# Patient Record
Sex: Female | Born: 1980 | Race: Black or African American | Hispanic: No | Marital: Married | Smoking: Current every day smoker
Health system: Southern US, Community
[De-identification: ages and names within clinical notes are randomized; demographics above are authoritative.]

## PROBLEM LIST (undated history)

## (undated) DIAGNOSIS — G8929 Other chronic pain: Secondary | ICD-10-CM

## (undated) DIAGNOSIS — M545 Low back pain, unspecified: Secondary | ICD-10-CM

## (undated) DIAGNOSIS — F329 Major depressive disorder, single episode, unspecified: Secondary | ICD-10-CM

## (undated) DIAGNOSIS — F32A Depression, unspecified: Secondary | ICD-10-CM

## (undated) DIAGNOSIS — F1010 Alcohol abuse, uncomplicated: Secondary | ICD-10-CM

## (undated) DIAGNOSIS — F410 Panic disorder [episodic paroxysmal anxiety] without agoraphobia: Secondary | ICD-10-CM

## (undated) DIAGNOSIS — F3160 Bipolar disorder, current episode mixed, unspecified: Secondary | ICD-10-CM

## (undated) DIAGNOSIS — I10 Essential (primary) hypertension: Secondary | ICD-10-CM

## (undated) DIAGNOSIS — R569 Unspecified convulsions: Secondary | ICD-10-CM

## (undated) DIAGNOSIS — J45909 Unspecified asthma, uncomplicated: Secondary | ICD-10-CM

---

## 2015-02-06 ENCOUNTER — Emergency Department (HOSPITAL_BASED_OUTPATIENT_CLINIC_OR_DEPARTMENT_OTHER)
Admission: EM | Admit: 2015-02-06 | Discharge: 2015-02-06 | Disposition: A | Discharge status: Home / Self Care | Payer: Medicare HMO | Attending: Emergency Medicine | Admitting: Emergency Medicine

## 2015-02-06 ENCOUNTER — Encounter (HOSPITAL_BASED_OUTPATIENT_CLINIC_OR_DEPARTMENT_OTHER): Payer: Self-pay | Admitting: Emergency Medicine

## 2015-02-06 DIAGNOSIS — F419 Anxiety disorder, unspecified: Secondary | ICD-10-CM | POA: Diagnosis not present

## 2015-02-06 DIAGNOSIS — I10 Essential (primary) hypertension: Secondary | ICD-10-CM | POA: Insufficient documentation

## 2015-02-06 DIAGNOSIS — Z720 Tobacco use: Secondary | ICD-10-CM | POA: Insufficient documentation

## 2015-02-06 DIAGNOSIS — R110 Nausea: Secondary | ICD-10-CM | POA: Diagnosis present

## 2015-02-06 DIAGNOSIS — Z880 Allergy status to penicillin: Secondary | ICD-10-CM | POA: Insufficient documentation

## 2015-02-06 DIAGNOSIS — J45909 Unspecified asthma, uncomplicated: Secondary | ICD-10-CM | POA: Diagnosis not present

## 2015-02-06 DIAGNOSIS — G43009 Migraine without aura, not intractable, without status migrainosus: Secondary | ICD-10-CM | POA: Diagnosis not present

## 2015-02-06 DIAGNOSIS — Z3202 Encounter for pregnancy test, result negative: Secondary | ICD-10-CM | POA: Insufficient documentation

## 2015-02-06 DIAGNOSIS — F329 Major depressive disorder, single episode, unspecified: Secondary | ICD-10-CM | POA: Diagnosis not present

## 2015-02-06 HISTORY — DX: Panic disorder (episodic paroxysmal anxiety): F41.0

## 2015-02-06 HISTORY — DX: Essential (primary) hypertension: I10

## 2015-02-06 HISTORY — DX: Unspecified asthma, uncomplicated: J45.909

## 2015-02-06 HISTORY — DX: Unspecified convulsions: R56.9

## 2015-02-06 LAB — URINALYSIS, ROUTINE W REFLEX MICROSCOPIC
Bilirubin Urine: NEGATIVE
GLUCOSE, UA: NEGATIVE mg/dL
KETONES UR: 15 mg/dL — AB
Nitrite: NEGATIVE
PH: 6 (ref 5.0–8.0)
Protein, ur: NEGATIVE mg/dL
Specific Gravity, Urine: 1.017 (ref 1.005–1.030)
UROBILINOGEN UA: 1 mg/dL (ref 0.0–1.0)

## 2015-02-06 LAB — URINE MICROSCOPIC-ADD ON

## 2015-02-06 LAB — PREGNANCY, URINE: Preg Test, Ur: NEGATIVE

## 2015-02-06 MED ORDER — IBUPROFEN 800 MG PO TABS
800.0000 mg | ORAL_TABLET | Freq: Once | ORAL | Status: AC
  Administered 2015-02-06: 800 mg via ORAL
  Filled 2015-02-06: qty 1

## 2015-02-06 MED ORDER — ACETAMINOPHEN 500 MG PO TABS
1000.0000 mg | ORAL_TABLET | Freq: Once | ORAL | Status: AC
  Administered 2015-02-06: 1000 mg via ORAL
  Filled 2015-02-06: qty 2

## 2015-02-06 MED ORDER — PROMETHAZINE HCL 25 MG PO TABS
25.0000 mg | ORAL_TABLET | Freq: Once | ORAL | Status: AC
  Administered 2015-02-06: 25 mg via ORAL
  Filled 2015-02-06: qty 1

## 2015-02-06 NOTE — ED Provider Notes (Signed)
CSN: [id number]     Arrival date & time 02/06/15  0100 History   First MD Initiated Contact with Patient 02/06/15 [id number]     Chief Complaint  Patient presents with  . Nausea  . Migraine  . Depression     (Consider location/radiation/quality/duration/timing/severity/associated sxs/prior Treatment) Patient is a 34 y.o. female presenting with migraines. The history is provided by the patient.  Migraine This is a recurrent problem. The current episode started yesterday. The problem occurs constantly. The problem has not changed since onset.Pertinent negatives include no chest pain, no abdominal pain and no shortness of breath. Nothing aggravates the symptoms. She has tried nothing for the symptoms. The treatment provided no relief.  Under stress due to partner's controlling mother.  Wants to leave relationship and is under stress related to same.  Denies SI or Hi no AH or VH  Past Medical History  Diagnosis Date  . Asthma   . Seizures   . Panic attack   . Hypertension    History reviewed. No pertinent past surgical history. History reviewed. No pertinent family history. History  Substance Use Topics  . Smoking status: Current Every Day Smoker  . Smokeless tobacco: Not on file  . Alcohol Use: No   OB History    No data available     Review of Systems  Eyes: Negative for photophobia.  Respiratory: Negative for shortness of breath.   Cardiovascular: Negative for chest pain, palpitations and leg swelling.  Gastrointestinal: Negative for abdominal pain.  Musculoskeletal: Negative for neck pain and neck stiffness.  Skin: Negative for rash.  Neurological: Negative for dizziness, seizures, facial asymmetry, speech difficulty, weakness, light-headedness and numbness.  Psychiatric/Behavioral: Negative for suicidal ideas, behavioral problems, confusion, sleep disturbance, self-injury, dysphoric mood and decreased concentration.  All other systems reviewed and are  negative.     Allergies  Penicillins  Home Medications   Prior to Admission medications   Not on File   BP 142/88 mmHg  Pulse 87  Temp(Src) 98.4 F (36.9 C) (Oral)  Resp 16  SpO2 98%  LMP 02/06/2015 (Approximate) Physical Exam  Constitutional: She is oriented to person, place, and time. She appears well-developed and well-nourished.  HENT:  Head: Normocephalic and atraumatic.  Mouth/Throat: Oropharynx is clear and moist.  Eyes: Conjunctivae and EOM are normal. Pupils are equal, round, and reactive to light.  Neck: Normal range of motion. Neck supple.  Cardiovascular: Normal rate, regular rhythm and intact distal pulses.   Pulmonary/Chest: Effort normal and breath sounds normal. No respiratory distress. She has no wheezes. She has no rales.  Abdominal: Soft. Bowel sounds are normal. There is no tenderness. There is no rebound and no guarding.  Musculoskeletal: Normal range of motion.  Neurological: She is alert and oriented to person, place, and time. She has normal reflexes. She displays normal reflexes. No cranial nerve deficit. Coordination normal.  Skin: Skin is warm and dry.  Psychiatric: Her mood appears anxious. Her affect is not blunt, not labile and not inappropriate. Her speech is not rapid and/or pressured. She is not agitated. Thought content is not paranoid and not delusional. Cognition and memory are not impaired. She does not exhibit a depressed mood. She expresses no homicidal and no suicidal ideation. She expresses no suicidal plans and no homicidal plans.    ED Course  Procedures (including critical care time) Labs Review Labs Reviewed  PREGNANCY, URINE  URINALYSIS, ROUTINE W REFLEX MICROSCOPIC    Imaging Review No results found.   EKG Interpretation  None      MDM   Final diagnoses:  None  Treated for migraine in the ED  She denies SI or HI she is under stress and wants to leave her partner due to a partner's controlling mother.  Will provide  resource guide.      Cy Blamer, MD 02/06/15 [phone]

## 2015-02-06 NOTE — Discharge Instructions (Signed)
Migraine Headache °A migraine headache is very bad, throbbing pain on one or both sides of your head. Talk to your doctor about what things may bring on (trigger) your migraine headaches. °HOME CARE °· Only take medicines as told by your doctor. °· Lie down in a dark, quiet room when you have a migraine. °· Keep a journal to find out if certain things bring on migraine headaches. For example, write down: °¨ What you eat and drink. °¨ How much sleep you get. °¨ Any change to your diet or medicines. °· Lessen how much alcohol you drink. °· Quit smoking if you smoke. °· Get enough sleep. °· Lessen any stress in your life. °· Keep lights dim if bright lights bother you or make your migraines worse. °GET HELP RIGHT AWAY IF:  °· Your migraine becomes really bad. °· You have a fever. °· You have a stiff neck. °· You have trouble seeing. °· Your muscles are weak, or you lose muscle control. °· You lose your balance or have trouble walking. °· You feel like you will pass out (faint), or you pass out. °· You have really bad symptoms that are different than your first symptoms. °MAKE SURE YOU:  °· Understand these instructions. °· Will watch your condition. °· Will get help right away if you are not doing well or get worse. °Document Released: 08/15/2008 Document Revised: 01/29/2012 Document Reviewed: 07/14/2013 °ExitCare® Patient Information ©2015 [employer]. This information is not intended to replace advice given to you by your health care provider. Make sure you discuss any questions you have with your health care provider. ° ° °Emergency Department Resource Guide °1) Find a Doctor and Pay Out of Pocket °Although you won't have to find out who is covered by your insurance plan, it is a good idea to ask around and get recommendations. You will then need to call the office and see if the doctor you have chosen will accept you as a new patient and what types of options they offer for patients who are self-pay. Some doctors  offer discounts or will set up payment plans for their patients who do not have insurance, but you will need to ask so you aren't surprised when you get to your appointment. ° °2) Contact Your Local Health Department °Not all health departments have doctors that can see patients for sick visits, but many do, so it is worth a call to see if yours does. If you don't know where your local health department is, you can check in your phone book. The CDC also has a tool to help you locate your state's health department, and many state websites also have listings of all of their local health departments. ° °3) Find a Walk-in Clinic °If your illness is not likely to be very severe or complicated, you may want to try a walk in clinic. These are popping up all over the country in pharmacies, drugstores, and shopping centers. They're usually staffed by nurse practitioners or physician assistants that have been trained to treat common illnesses and complaints. They're usually fairly quick and inexpensive. However, if you have serious medical issues or chronic medical problems, these are probably not your best option. ° °No Primary Care Doctor: °- Call [facility] Connect at  832-8000 - they can help you locate a primary care doctor that  accepts your insurance, provides certain services, etc. °- Physician Referral Service- 1-800-533-3463 ° °Chronic Pain Problems: °Organization         Address    Phone   Notes  °[facility]  (336) 297-2271 Patients need to be referred by their primary care doctor.  ° °Medication Assistance: °Organization         Address  Phone   Notes  °[place] [facility] [address] °[place], [place] [address] (336) 641-8030 --Must be a resident of [place] °-- Must have NO insurance coverage whatsoever (no Medicaid/ Medicare, etc.) °-- The pt. MUST have a primary care doctor that directs their care regularly and follows them in the community °   °[facility]  (866) 331-1348   °[employer]  (888) 892-1162   ° °Agencies that provide inexpensive medical care: °Organization         Address  Phone   Notes  °[facility]  (336) 832-8035   °[facility]    (336) 832-7272   °[facility] [address] °[place], [place] [address] (336) 832-4777   °[facility] [address], °[place] (336) 271-4999   °[facility]    (336) 373-0678   °[facility]    (336) 272-1050   °[facility] ° [address], [place] Phone:  (336) 832-4444, Fax:  (336) 832-4440 Hours of Operation:  9 am - 6 pm, M-F.  Also accepts Medicaid/Medicare and self-pay.  °[facility] ° [address], [place] Phone: (336) 832-3150, Fax: (336) [phone]. Hours of Operation:  8:30 am - 5:30 pm, M-F.  Also accepts Medicaid and self-pay.  °[facility] [place] [address], [place] Phone: (336) 878-6027   °[facility] [address], [place], [place] (336)723-1848, Ext. 123 Mondays & Thursdays: 7-9 AM.  First 15 patients are seen on a first come, first serve basis. °  ° °Medicaid-accepting [place] Providers: ° °Organization         Address  Phone   Notes  °[facility] [address], [place] (336) 641-2100 Also accepts self-pay patients.  °[facility] [address], [place] ° (336) 856-9996   °[facility] [address], [place] (336) 288-8857   °[facility] [address] [place] [address], [place] (336) 299-7000   °Veita Bland [address], [place]  ° (336) 373-1557 Only accepts [place] Access Medicaid patients after they have their name applied to their card.  ° °Self-Pay (no insurance) in [place]: ° °Organization         Address  Phone   Notes  °Sickle Cell Patients, [facility] [address], [place] (336) 832-1970   °[facility] [address], [place] (336) 832-4400   °[facility] ° [address], [place] (336) 992-4800   °[facility]/Dr. Osei-Bonsu ° [address] [place] [address], [place] or [address], [place] (336) 841-8500 Phone number for both [place] and [place] locations is the same.  °[facility] [address], [place] (336) 299-0000   °[facility]  [address] [place] [address],  or [address] (336) 852-7530 °(336) 878-2260   °[facility] [address],  (336) 350-1642, phone; (336) 294-5005, fax Sees patients 1st and 3rd Saturday of every month.  Must not qualify for public   or private insurance (i.e. Medicaid, Medicare, [place] Health Choice, Veterans' Benefits)  Household income should be no more than 200% of the poverty level The clinic cannot treat you if you are pregnant or think you are pregnant  Sexually transmitted diseases are not treated at the clinic.    Dental Care: Organization         Address  Phone  Notes  [facility] [place] [phone] Accepts children up to age 66 who are enrolled in [place] or [place]; pregnant women with a Medicaid card; and children who have applied for Medicaid or [place] Health Choice, but were declined, whose parents can pay a reduced fee at time of service.  [facility]  [address], [place] (361) 499-3467 Accepts children up to age 38 who are enrolled in [place] or [place]; pregnant women with a Medicaid card; and children who have applied for Medicaid or [place] Health Choice, but were declined, whose parents can pay a reduced fee at time of service.  [facility]  [place] (501)159-6478 Patients are seen by appointment only. Walk-ins are not accepted. [place] will see patients 32 years of age and older. Monday - Tuesday (8am-5pm) Most Wednesdays (8:30-5pm) $30 per visit, cash only  [facility]  [address], [facility] [phone] Patients are seen by appointment only. Walk-ins are not accepted. [place] will see patients 40 years of age and older. One Wednesday Evening (Monthly: Volunteer Based).  $30 per visit, cash only  [place]  [phone] for adults; Children under age 59, call Graduate Pediatric Dentistry at ([phone]. Children aged 66-14, please call [phone] to request a pediatric application.  Dental services are provided in all areas of dental care including fillings, crowns and bridges, complete and partial dentures, implants, gum treatment, root canals, and extractions. Preventive care is also provided. Treatment is provided to both adults and children. Patients are selected via a lottery and there is often a waiting list.   [facility] [address], [place]  ([phone] [email or web address]   [facility] [address] [place], [place] [phone], Ext. 123 Second and Fourth Thursday of each month, opens at 6:30 AM; Clinic ends at 9 AM.  Patients are seen on a first-come first-served basis, and a limited number are seen during each clinic.   [facility]  [address] [place], [place] [phone]   Eligibility Requirements You must have lived in [place], [place], or [place] counties for at least the last three months.   You cannot be eligible for state or federal sponsored [employer], including [employer], [place], or [employer].   You generally cannot be eligible for healthcare insurance through your employer.    How to apply: Eligibility screenings are held every Tuesday and  Wednesday afternoon from 1:00 pm until 4:00 pm. You do not need an appointment for the interview!  [facility] [address], [place], [place]   [place]  [place] Department  [place]  [phone]    Behavioral Health Resources in the Community: Intensive Outpatient Programs Organization         Address  Phone  Notes  [facility] [address], [place], [place] 336-878-6098   °[facility] [address], [place], [place] 336-832-9800   °[facility] [address], [place], [place] ° 336-882-2125   °[facility] [address],  °[place], [place] 1-800-853-5163 or 336-641-4981   °Substance Abuse Resources °Organization         Address  Phone  Notes  °[facility]  336-882-2125   °[facility]  336-784-9470   °[facility]  336-285-9073   °[facility]  336-845-3988   °Residential & Outpatient Substance Abuse Program  1-800-659-3381   °Psychological Services °Organization         Address  Phone  Notes  °[facility]  336- 832-9600   °[facility]  336- 378-7881   °[facility] [address], [place] 1-800-853-5163 or 336-641-4981   ° °Mobile Crisis Teams °Organization         Address  Phone  Notes  °[facility], Mobile Crisis Care Unit  1-877-626-1772   °Assertive °[facility] ° [address] [place], [place] 336-834-9664   °Sharon DeEsch [address] °[place] 336-554-5454   ° °Self-Help/Support Groups °Organization         Address  Phone             Notes  °[facility] [place] - variety of support groups  336- 373-1402 Call for more information  °Narcotics Anonymous (NA), [facility] [address], °[place]  2 meetings at this location  ° °Residential  Treatment Programs °Organization         Address  Phone  Notes  °[facility] [address],    °[place]  1-866-801-8205   °[facility] ° [address], [place], [place] 704-293-8524   °[facility] [address], [place] 336-845-3988 Admissions: 8am-3pm M-F  °[facility] [address]    °[place], [place] 336-841-1104   °[facility] [address], [place], [place] 336-379-7146   °[facility] [address]  °[place], [place] 336-285-9073   °[facility] - Intensive Outpatient [address], [place], [place] 336-852-3033   °[facility] [address]  °[place], [place] 1-877-615-2722 or 336-784-9470   °[facility] ([facility]) [address] [place], [place] 336-227-7417 Accepts Medicaid  °[facility] [address]  ° [place] 1-800-659-3381 Substance Abuse/Addiction Treatment  ° °[place] Behavioral Health Resources °Organization         Address  Phone  Notes  °[facility]  (888) 581-9988   °Julie Brannon, PhD [address] [place], [place]   (336) 349-5553 or (336) 951-0000   °[facility]   [address] °[place], [place] (336) 349-4454   °[facility] [address], [place], [place] (336) 342-8316 Insurance/Medicaid/sponsorship through [facility]  °[facility] [address]                                    [place], [place] (336) 342-8316 Therapy/tele-psych/case  °[facility] [address]  ° , [place] (336) 349-2233    °Dr. Arfeen  (336) 349-4544   °[facility] [place]  [employer] [place] [facility]   Dept. 1) [address], [place] °2) [address], [place] °3)  [address], [place] (336) 349-3220 °(336) 342-7768 ° °(336) 342-8140   °[place] Child Abuse Hotline (336) 342-1394 or (336) 342-3537 (After Hours)     ° ° ° °

## 2015-02-06 NOTE — ED Notes (Signed)
Patient states that she is feeling "very depressed" - she reports that she is so depressed that her whole body is aching and she is having a migraine headache.

## 2015-08-17 ENCOUNTER — Emergency Department (HOSPITAL_BASED_OUTPATIENT_CLINIC_OR_DEPARTMENT_OTHER)
Admission: EM | Admit: 2015-08-17 | Discharge: 2015-08-17 | Disposition: A | Discharge status: Home / Self Care | Payer: Medicare Other | Attending: Emergency Medicine | Admitting: Emergency Medicine

## 2015-08-17 ENCOUNTER — Encounter (HOSPITAL_BASED_OUTPATIENT_CLINIC_OR_DEPARTMENT_OTHER): Payer: Self-pay | Admitting: Adult Health

## 2015-08-17 DIAGNOSIS — F419 Anxiety disorder, unspecified: Secondary | ICD-10-CM | POA: Diagnosis not present

## 2015-08-17 DIAGNOSIS — R52 Pain, unspecified: Secondary | ICD-10-CM | POA: Diagnosis present

## 2015-08-17 DIAGNOSIS — M791 Myalgia, unspecified site: Secondary | ICD-10-CM

## 2015-08-17 DIAGNOSIS — E669 Obesity, unspecified: Secondary | ICD-10-CM | POA: Insufficient documentation

## 2015-08-17 DIAGNOSIS — J45909 Unspecified asthma, uncomplicated: Secondary | ICD-10-CM | POA: Insufficient documentation

## 2015-08-17 DIAGNOSIS — R1084 Generalized abdominal pain: Secondary | ICD-10-CM | POA: Diagnosis not present

## 2015-08-17 DIAGNOSIS — F329 Major depressive disorder, single episode, unspecified: Secondary | ICD-10-CM | POA: Diagnosis not present

## 2015-08-17 DIAGNOSIS — Z880 Allergy status to penicillin: Secondary | ICD-10-CM | POA: Diagnosis not present

## 2015-08-17 DIAGNOSIS — I10 Essential (primary) hypertension: Secondary | ICD-10-CM | POA: Diagnosis not present

## 2015-08-17 DIAGNOSIS — Z720 Tobacco use: Secondary | ICD-10-CM | POA: Insufficient documentation

## 2015-08-17 DIAGNOSIS — F411 Generalized anxiety disorder: Secondary | ICD-10-CM

## 2015-08-17 MED ORDER — ACETAMINOPHEN 325 MG PO TABS
ORAL_TABLET | ORAL | Status: DC
Start: 2015-08-17 — End: 2015-08-18
  Filled 2015-08-17: qty 2

## 2015-08-17 MED ORDER — HYDROXYZINE HCL 25 MG PO TABS: 25.0000 mg | ORAL_TABLET | Freq: Three times a day (TID) | ORAL | Status: AC | PRN

## 2015-08-17 NOTE — ED Notes (Signed)
Patient states that pain is all over from head to go and the pain has stay there for 2 days.  She states the pain has gotten worse.

## 2015-08-17 NOTE — ED Notes (Signed)
Pt refused tylenol for pain, states it doesn't help

## 2015-08-17 NOTE — ED Provider Notes (Signed)
CSN: [id number]     Arrival date & time 08/17/15  1959 History   First MD Initiated Contact with Patient 08/17/15 2020     Chief Complaint  Patient presents with  . Pain     (Consider location/radiation/quality/duration/timing/severity/associated sxs/prior Treatment) HPI   34 year old female with history of panic attack, who presents for evaluation of generalized pain.  Increase stress x 2 days causing her to have generalized pain.  Report sharp pain throughout head, body, lower back and rates as 9/10.  Report recurrent panic attack this past week due to stress, which she did not specify actual cause.  She has been taking tylenol at home without relief.   she admits that she has recurrent bouts of panic attack. She also admits that when she is stressed out he developed generalized body aches. The symptoms are similar to prior. She did follow-up with a counselor and requesting for "a shot" to help with the symptoms that was declined. She denies any active suicidal homicidal ideation. She denies self medicating with alcohol or street drugs. She admits that her appetite has been decreased, and she is sleeping more than usual. She denies any active chest pain, shortness of breath, diaphoresis, abdominal pain, nausea vomiting diarrhea, focal numbness weakness or rash.   Past Medical History  Diagnosis Date  . Asthma   . Seizures   . Panic attack   . Hypertension    History reviewed. No pertinent past surgical history. History reviewed. No pertinent family history. Social History  Substance Use Topics  . Smoking status: Current Every Day Smoker  . Smokeless tobacco: None  . Alcohol Use: No   OB History    No data available     Review of Systems  All other systems reviewed and are negative.     Allergies  Penicillins  Home Medications   Prior to Admission medications   Not on File   BP 155/90 mmHg  Pulse 101  Temp(Src) 98.8 F (37.1 C) (Oral)  Resp 20  Ht  (1.6 m)   Wt 241 lb (109.317 kg)  BMI 42.70 kg/m2  SpO2 97%  LMP 05/17/2015 Physical Exam  Constitutional: She is oriented to person, place, and time. She appears well-developed and well-nourished. No distress.  Obese African-American female, tearful, but nontoxic in appearance.  HENT:  Head: Atraumatic.  Eyes: Conjunctivae are normal.  Neck: Neck supple.  Cardiovascular: Normal rate and regular rhythm.   Pulmonary/Chest: Effort normal and breath sounds normal.  Abdominal: Soft. There is no tenderness.  Musculoskeletal: She exhibits tenderness (Mild generalized tenderness when palpate throughout her body without focal point tenderness.).  Neurological: She is alert and oriented to person, place, and time.  Skin: No rash noted.  Psychiatric: Her speech is normal. Judgment normal. She is withdrawn. Thought content is not paranoid. Cognition and memory are normal. She exhibits a depressed mood. She expresses no homicidal and no suicidal ideation.  Nursing note and vitals reviewed.   ED Course  Procedures (including critical care time)  patient presents with generalized body aches which she attributed to stress.  No concerning finding on exam.  Doubt acute emergent medical condition.  Will prescribe atarax for anxiety and outpt resources for f/u.     MDM   Final diagnoses:  Myalgia  Anxiety, generalized    BP 155/90 mmHg  Pulse 101  Temp(Src) 98.8 F (37.1 C) (Oral)  Resp 20  Ht  (1.6 m)  Wt 241 lb (109.317 kg)  BMI 42.70  kg/m2  SpO2 97%  LMP 05/17/2015     Fayrene Helper, PA-C 08/17/15 2129  Lavera Guise, MD 08/18/15 [phone]

## 2015-08-17 NOTE — Discharge Instructions (Signed)
°Emergency Department Resource Guide °1) Find a Doctor and Pay Out of Pocket °Although you won't have to find out who is covered by your insurance plan, it is a good idea to ask around and get recommendations. You will then need to call the office and see if the doctor you have chosen will accept you as a new patient and what types of options they offer for patients who are self-pay. Some doctors offer discounts or will set up payment plans for their patients who do not have insurance, but you will need to ask so you aren't surprised when you get to your appointment. ° °2) Contact Your Local Health Department °Not all health departments have doctors that can see patients for sick visits, but many do, so it is worth a call to see if yours does. If you don't know where your local health department is, you can check in your phone book. The CDC also has a tool to help you locate your state's health department, and many state websites also have listings of all of their local health departments. ° °3) Find a Walk-in Clinic °If your illness is not likely to be very severe or complicated, you may want to try a walk in clinic. These are popping up all over the country in pharmacies, drugstores, and shopping centers. They're usually staffed by nurse practitioners or physician assistants that have been trained to treat common illnesses and complaints. They're usually fairly quick and inexpensive. However, if you have serious medical issues or chronic medical problems, these are probably not your best option. ° °No Primary Care Doctor: °- Call [facility] at  832-8000 - they can help you locate a primary care doctor that  accepts your insurance, provides certain services, etc. °- Physician Referral Service- 1-800-533-3463 ° °Chronic Pain Problems: °Organization         Address  Phone   Notes  °[facility]  (336) 297-2271 Patients need to be referred by their primary care doctor.  ° °Medication  Assistance: °Organization         Address  Phone   Notes  °[place] [facility] [address] °[place], [place] [address] (336) 641-8030 --Must be a resident of [place] °-- Must have NO insurance coverage whatsoever (no Medicaid/ Medicare, etc.) °-- The pt. MUST have a primary care doctor that directs their care regularly and follows them in the community °  °[facility]  (866) 331-1348   °[employer]  (888) 892-1162   ° °Agencies that provide inexpensive medical care: °Organization         Address  Phone   Notes  °[facility]  (336) 832-8035   °[facility]    (336) 832-7272   °[facility] [address] °[place], [place] [address] (336) 832-4777   °[facility] [address], °[place] (336) 271-4999   °[facility]    (336) 373-0678   °[facility]    (336) 272-1050   °[facility] ° [address], [place] Phone:  (336) 832-4444, Fax:  (336) 832-4440 Hours of Operation:  9 am - 6 pm, M-F.  Also accepts Medicaid/Medicare and self-pay.  °[facility] ° [address],  Phone: (336) 832-3150, Fax: (336) 832-3151. Hours of Operation:  8:30 am - 5:30 pm, M-F.  Also accepts Medicaid and self-pay.  °[facility] [place] [address], [place] Phone: (336) 878-6027   °[facility] [address], [place], [place] (336)723-1848, Ext. 123 Mondays & Thursdays: 7-9 AM.  First 15 patients are seen on a first come, first serve basis. °  ° °Medicaid-accepting [place] Providers: ° °Organization         Address  Phone   Notes  °[facility] [address], [place] (336) 641-2100 Also accepts self-pay patients.  °[facility] [address], [place] ° (336) 856-9996   °[facility] [address], [place]  (336) 288-8857   °[facility] [address] [place] [address], [place] (336) 299-7000   °Veita Bland [address], [place]  ° (336) 373-1557 Only accepts [place] Access Medicaid patients after they have their name applied to their card.  ° °Self-Pay (no insurance) in [place]: ° °Organization         Address  Phone   Notes  °Sickle Cell Patients, [facility] [address], [place] (336) 832-1970   °[facility] [address], [place] (336) 832-4400   °[facility] [place] ° [address], [place] (336) 992-4800   °[facility]/Dr. Osei-Bonsu ° [address] [place] [address], [place] or [address], [place] (336) 841-8500 Phone number for both [place] and [place] locations is the same.  °[facility] [address], [place] (336) 299-0000   °[facility] [address] [place] [address], [place] or [address] (336) 852-7530 °(336) 878-2260   °[facility] [address],  (336) 350-1642, phone; (336) 294-5005, fax Sees patients 1st and 3rd Saturday of every month.  Must not qualify for public or private insurance (i.e. Medicaid, Medicare, [place] Health Choice, Veterans' Benefits) • Household income should be no more than 200% of the poverty level •The clinic cannot treat you if you are pregnant or think you are pregnant • Sexually transmitted diseases are not treated at the clinic.  ° ° °Dental Care: °Organization         Address  Phone  Notes  °[place] [facility] [address],  (336) 641-6152 Accepts children up to age 21 who are enrolled in Medicaid or [place] Health Choice; pregnant women with a Medicaid card; and children who have applied for Medicaid or [place] Health Choice, but were declined, whose parents can pay a reduced fee at time of service.  °[place]  [facility] [place]  [address], [place] (336) 641-7733 Accepts children up to age 21 who are enrolled in Medicaid or [place] Health Choice; pregnant women with a Medicaid card; and children who have applied for Medicaid or  Health Choice, but were declined, whose parents can pay a reduced fee at time of service.  °[facility] ° [address],  (336) 641-4533 Patients are seen by appointment only. Walk-ins are not accepted. [facility] will see patients 18 years of age and older. °Monday - Tuesday (8am-5pm) °Most Wednesdays (8:30-5pm) °$30 per visit, cash only  °[facility] ° [address], [place] (336) 641-4533 Patients are seen by appointment only. Walk-ins are not accepted. [facility] will see patients 18 years of age and older. °One   Wednesday Evening (Monthly: Volunteer Based).  $30 per visit, cash only  °[facility]  (919) 537-3737 for adults; Children under age 4, call Graduate Pediatric Dentistry at (919) [phone]. Children aged 4-14, please call (919) [phone] to request a pediatric application. ° Dental services are provided in all areas of dental care including fillings, crowns and bridges, complete and partial dentures, implants, gum treatment, root canals, and extractions. Preventive care is also provided. Treatment is provided to both adults and children. °Patients are selected via a lottery and there is often a waiting list. °  °[facility] [address], °[place] ° (336) 763-8833 [email or web address] °  °[facility] [address], [place], [place] (336)723-1848, Ext. 123 Second and Fourth Thursday of each month, opens at 6:30 AM; Clinic ends at 9 AM.  Patients are seen on a first-come first-served basis, and a limited number are seen during each clinic.  ° °[facility] ° [address], [place], [place] (336) 723-7904    Eligibility Requirements °You must have lived in [place], [place], or [place] counties for at least the last three months. °  You cannot be eligible for state or federal sponsored healthcare insurance, including Veterans Administration, Medicaid, or Medicare. °  You generally cannot be eligible for healthcare insurance through your employer.  °  How to apply: °Eligibility screenings are held every Tuesday and Wednesday afternoon from 1:00 pm until 4:00 pm. You do not need an appointment for the interview!  °[facility] [address], [place], [place] 336-631-2330   °[facility]  336-342-8273   °[facility]  336-703-3100   °[facility]  336-570-6415   ° °Behavioral Health Resources in the Community: °Intensive Outpatient Programs °Organization         Address  Phone  Notes  °[facility] [address], [place], [place] 336-878-6098   °[facility] [address], [place], [place] 336-832-9800   °[facility] [address], [place], [place] ° 336-882-2125   °[facility] [address],  °[place], [place] 1-800-853-5163 or 336-641-4981   °Substance Abuse Resources °Organization         Address  Phone  Notes  °[facility]  336-882-2125   °[facility]  336-784-9470   °[facility]  336-285-9073   °[facility]  336-845-3988   °Residential & Outpatient Substance Abuse Program  1-800-659-3381   °Psychological Services °Organization         Address  Phone  Notes  ° Health  336- 832-9600   °[facility]  336- 378-7881   °[facility] [address], [place] 1-800-853-5163 or 336-641-4981   ° °Mobile Crisis Teams °Organization         Address  Phone  Notes  °[facility], Mobile Crisis Care Unit  1-877-626-1772   °Assertive °[facility] ° [address]  [place], [place] 336-834-9664   °Sharon DeEsch [address] °[place] 336-554-5454   ° °Self-Help/Support Groups °Organization         Address  Phone             Notes  °[facility] - variety of support groups  336- 373-1402 Call for more information  °Narcotics Anonymous (NA), [facility] [address], °[place]  2 meetings at this location  ° °  Residential Treatment Programs °Organization         Address  Phone  Notes  °[facility] [address],    °[place]  1-866-801-8205   °[facility] ° [address], [place], [place] 704-293-8524   °[facility] [address], [place] 336-845-3988 Admissions: 8am-3pm M-F  °[facility] [address]    °[place], [place] 336-841-1104   °[facility] [address], [place], [place] 336-379-7146   °[facility] [address]  °[place], [place] 336-285-9073   °[facility] Intensive Outpatient [address], [place], [place] 336-852-3033   °[facility] [address]  °[place], [place] 1-877-615-2722 or 336-784-9470   °[facility] [address] [place], [place] 336-227-7417 Accepts Medicaid  °[facility] [address]  °[place] 1-800-659-3381 Substance Abuse/Addiction Treatment  ° °[place] Behavioral Health Resources °Organization         Address  Phone  Notes  °[facility]  (888) 581-9988   °Julie Brannon, PhD [address] [place], [place]   (336) 349-5553 or (336) 951-0000   °[facility]   [address] °[place], [place] (336) 349-4454   °[facility] [address], [place], [place] (336) 342-8316 Insurance/Medicaid/sponsorship through [facility]  °[facility] [address]                                    [place], [place] (336) 342-8316 Therapy/tele-psych/case    °[facility] [address]  ° , [place] (336) 349-2233    °Dr. Arfeen  (336) 349-4544   °[facility] [place]  [employer] [place] [facility] 1) [address],  °2) [address], [place] °3)  [address], [place] (336) 349-3220 °(336) 342-7768 ° °(336) 342-8140   °[place] [facility] (336) 342-1394 or (336) 342-3537 (After Hours)    ° ° °

## 2015-08-17 NOTE — ED Notes (Signed)
Presents with generalized body pain began a couple days ago and is worse this evening. Pt is tearful-pain is decribed as sharp and nothing makes pain worse or better.

## 2016-09-19 ENCOUNTER — Emergency Department (HOSPITAL_BASED_OUTPATIENT_CLINIC_OR_DEPARTMENT_OTHER): Payer: Medicare Other

## 2016-09-19 ENCOUNTER — Encounter (HOSPITAL_BASED_OUTPATIENT_CLINIC_OR_DEPARTMENT_OTHER): Payer: Self-pay | Admitting: Emergency Medicine

## 2016-09-19 ENCOUNTER — Emergency Department (HOSPITAL_BASED_OUTPATIENT_CLINIC_OR_DEPARTMENT_OTHER)
Admission: EM | Admit: 2016-09-19 | Discharge: 2016-09-19 | Disposition: A | Discharge status: Home / Self Care | Payer: Medicare Other | Attending: Physician Assistant | Admitting: Physician Assistant

## 2016-09-19 DIAGNOSIS — J45909 Unspecified asthma, uncomplicated: Secondary | ICD-10-CM | POA: Insufficient documentation

## 2016-09-19 DIAGNOSIS — J069 Acute upper respiratory infection, unspecified: Secondary | ICD-10-CM

## 2016-09-19 DIAGNOSIS — F17200 Nicotine dependence, unspecified, uncomplicated: Secondary | ICD-10-CM | POA: Diagnosis not present

## 2016-09-19 DIAGNOSIS — B9789 Other viral agents as the cause of diseases classified elsewhere: Secondary | ICD-10-CM

## 2016-09-19 DIAGNOSIS — I10 Essential (primary) hypertension: Secondary | ICD-10-CM | POA: Diagnosis not present

## 2016-09-19 DIAGNOSIS — R05 Cough: Secondary | ICD-10-CM | POA: Diagnosis present

## 2016-09-19 MED ORDER — BENZONATATE 100 MG PO CAPS: 100.0000 mg | ORAL_CAPSULE | Freq: Three times a day (TID) | ORAL | 0 refills | Status: DC

## 2016-09-19 NOTE — ED Triage Notes (Signed)
Patient states that she is having SOB - with some congestion nasal congestion. Patient reports that she is coughing and it is productive with green sputum.

## 2016-09-19 NOTE — ED Notes (Signed)
Pt c/o cough, head and chest congestion, wheezing x 2-3 days

## 2016-09-19 NOTE — ED Provider Notes (Signed)
[facility] Provider Note   CSN: [id number] Arrival date & time: 09/19/16  2222   By signing my name below, I, Nelwyn SalisburyJoshua Fowler, attest that this documentation has been prepared under the direction and in the presence of non-physician practitioner, Audry Piliyler Nava Song, PA-C. Electronically Signed: Nelwyn SalisburyJoshua Fowler, Scribe. 09/19/2016. 10:43 PM.  History   Chief Complaint Chief Complaint  Patient presents with  . Cough   HPI  HPI Comments:  Courtney Garrett is a 35 y.o. female with Pmhx of seizures and HTN who presents to the Emergency Department complaining of sudden-onset constant unchanged cough beginning 3 days ago. She notes she has tried Mucinex with no relief. Pt also reports associated SOB, chest congestion, wheezing, chills, diaphoresis, fever (101 at home), vomiting, diarrhea, diffuse body aches, and lower back pain. She denies any sick contacts. No other symptoms noted.   Past Medical History:  Diagnosis Date  . Asthma   . Hypertension   . Panic attack   . Seizures (HCC)      There are no active problems to display for this patient.   History reviewed. No pertinent surgical history.  OB History    No data available      Home Medications    Prior to Admission medications   Medication Sig Start Date End Date Taking? Authorizing Provider  hydrOXYzine (ATARAX/VISTARIL) 25 MG tablet Take 1 tablet (25 mg total) by mouth every 8 (eight) hours as needed for anxiety. 08/17/15   Fayrene HelperBowie Tran, PA-C    Family History History reviewed. No pertinent family history.  Social History Social History  Substance Use Topics  . Smoking status: Current Every Day Smoker  . Smokeless tobacco: Never Used  . Alcohol use No     Allergies   Penicillins   Review of Systems Review of Systems  Constitutional: Positive for fever.  HENT: Positive for congestion and sore throat.   Respiratory: Positive for cough and wheezing. Negative for shortness of breath.   Cardiovascular:  Negative for chest pain.   Physical Exam Updated Vital Signs BP 155/99 (BP Location: Right Arm)   Pulse 90   Temp 98.2 F (36.8 C) (Oral)   Resp 20   Ht 5\' 3"  (1.6 m)   Wt 231 lb (104.8 kg)   SpO2 97%   BMI 40.92 kg/m   Physical Exam  Constitutional: She is oriented to person, place, and time. She appears well-developed and well-nourished. No distress.  HENT:  Head: Normocephalic and atraumatic.  Right Ear: Tympanic membrane, external ear and ear canal normal.  Left Ear: Tympanic membrane, external ear and ear canal normal.  Nose: Nose normal.  Mouth/Throat: Uvula is midline, oropharynx is clear and moist and mucous membranes are normal. No trismus in the jaw. No oropharyngeal exudate, posterior oropharyngeal erythema or tonsillar abscesses.  Eyes: Conjunctivae and EOM are normal. Pupils are equal, round, and reactive to light.  Neck: Normal range of motion. Neck supple. No tracheal deviation present.  Cardiovascular: Normal rate, regular rhythm, S1 normal, S2 normal, normal heart sounds, intact distal pulses and normal pulses.   Pulmonary/Chest: Effort normal and breath sounds normal. No respiratory distress. She has no decreased breath sounds. She has no wheezes. She has no rhonchi. She has no rales.  Abdominal: Soft. Normal appearance and bowel sounds are normal. She exhibits no distension. There is no tenderness.  Musculoskeletal: Normal range of motion.  Neurological: She is alert and oriented to person, place, and time.  Skin: Skin is warm and dry.  Psychiatric: She has a normal mood and affect. Her speech is normal and behavior is normal. Thought content normal.  Nursing note and vitals reviewed.  ED Treatments / Results  DIAGNOSTIC STUDIES:  Oxygen Saturation is 97% on RA, normal by my interpretation.    COORDINATION OF CARE:  10:48 PM Discussed treatment plan with pt at bedside which includes cxr and pt agreed to plan.  Labs (all labs ordered are listed, but only  abnormal results are displayed) Labs Reviewed - No data to display  EKG  EKG Interpretation None      Radiology Dg Chest 2 View  Result Date: 09/19/2016 CLINICAL DATA:  35 y/o F; cough, congestion, and shortness of breath. EXAM: CHEST  2 VIEW COMPARISON:  None. FINDINGS: The heart size and mediastinal contours are within normal limits. Both lungs are clear. The visualized skeletal structures are unremarkable. IMPRESSION: No active cardiopulmonary disease. Electronically Signed   By: Mitzi HansenLance  Furusawa-Stratton M.D.   On: 09/19/2016 23:21    Procedures Procedures (including critical care time)  Medications Ordered in ED Medications - No data to display   Initial Impression / Assessment and Plan / ED Course  I have reviewed the triage vital signs and the nursing notes.  Pertinent labs & imaging results that were available during my care of the patient were reviewed by me and considered in my medical decision making (see chart for details).  Clinical Course   Final Clinical Impressions(s) / ED Diagnoses  I have reviewed and evaluated the relevant imaging studies.  I have reviewed the relevant previous healthcare records. I obtained HPI from historian.  ED Course:  Assessment: Pt is a 35yF presents with URI symptoms x 3 days. Cough. Subjective Fevers . On exam, pt in NAD. VSS. Afebrile. Lungs CTA, Heart RRR. Abdomen nontender/soft. Pt CXR negative for acute infiltrate. Patients symptoms are consistent with URI, likely viral etiology. Discussed that antibiotics are not indicated for viral infections. Pt will be discharged with symptomatic treatment.  Verbalizes understanding and is agreeable with plan. Pt is hemodynamically stable & in NAD prior to dc.  Disposition/Plan:  DC Home Additional Verbal discharge instructions given and discussed with patient.  Pt Instructed to f/u with PCP in the next week for evaluation and treatment of symptoms. Return precautions given Pt acknowledges  and agrees with plan  Supervising Physician Courteney Randall AnLyn Mackuen, MD   Final diagnoses:  Viral URI with cough    New Prescriptions New Prescriptions   No medications on file   I personally performed the services described in this documentation, which was scribed in my presence. The recorded information has been reviewed and is accurate.     Audry Piliyler Taline Nass, PA-C 09/19/16 2324    Courteney Randall AnLyn Mackuen, MD 09/20/16 1504

## 2016-09-19 NOTE — Discharge Instructions (Signed)
Please read and follow all provided instructions.  Your diagnoses today include:  1. Viral URI with cough     You appear to have an upper respiratory infection (URI). An upper respiratory tract infection, or cold, is a viral infection of the air passages leading to the lungs. It should improve gradually after 5-7 days. You may have a lingering cough that lasts for 2- 4 weeks after the infection.  Tests performed today include: Vital signs. See below for your results today.   Medications prescribed:   Take any prescribed medications only as directed. Treatment for your infection is aimed at treating the symptoms. There are no medications, such as antibiotics, that will cure your infection.   Home care instructions:  Follow any educational materials contained in this packet.   Your illness is contagious and can be spread to others, especially during the first 3 or 4 days. It cannot be cured by antibiotics or other medicines. Take basic precautions such as washing your hands often, covering your mouth when you cough or sneeze, and avoiding public places where you could spread your illness to others.   Please continue drinking plenty of fluids.  Use over-the-counter medicines as needed as directed on packaging for symptom relief.  You may also use ibuprofen or tylenol as directed on packaging for pain or fever.  Do not take multiple medicines containing Tylenol or acetaminophen to avoid taking too much of this medication.  Follow-up instructions: Please follow-up with your primary care provider in the next 3 days for further evaluation of your symptoms if you are not feeling better.   Return instructions:  Please return to the Emergency Department if you experience worsening symptoms.  RETURN IMMEDIATELY IF you develop shortness of breath, confusion or altered mental status, a new rash, become dizzy, faint, or poorly responsive, or are unable to be cared for at home. Please return if you have  persistent vomiting and cannot keep down fluids or develop a fever that is not controlled by tylenol or motrin.   Please return if you have any other emergent concerns.  Additional Information:  Your vital signs today were: BP 155/99 (BP Location: Right Arm)    Pulse 90    Temp 98.2 F (36.8 C) (Oral)    Resp 20    Ht 5\' 3"  (1.6 m)    Wt 104.8 kg    SpO2 97%    BMI 40.92 kg/m  If your blood pressure (BP) was elevated above 135/85 this visit, please have this repeated by your doctor within one month. --------------

## 2017-03-15 ENCOUNTER — Encounter (HOSPITAL_BASED_OUTPATIENT_CLINIC_OR_DEPARTMENT_OTHER): Payer: Self-pay | Admitting: Emergency Medicine

## 2017-03-15 ENCOUNTER — Emergency Department (HOSPITAL_BASED_OUTPATIENT_CLINIC_OR_DEPARTMENT_OTHER)
Admission: EM | Admit: 2017-03-15 | Discharge: 2017-03-16 | Disposition: A | Discharge status: Home / Self Care | Payer: Medicare HMO | Attending: Emergency Medicine | Admitting: Emergency Medicine

## 2017-03-15 DIAGNOSIS — F17200 Nicotine dependence, unspecified, uncomplicated: Secondary | ICD-10-CM | POA: Insufficient documentation

## 2017-03-15 DIAGNOSIS — J209 Acute bronchitis, unspecified: Secondary | ICD-10-CM | POA: Diagnosis not present

## 2017-03-15 DIAGNOSIS — I10 Essential (primary) hypertension: Secondary | ICD-10-CM | POA: Diagnosis not present

## 2017-03-15 DIAGNOSIS — R05 Cough: Secondary | ICD-10-CM | POA: Diagnosis present

## 2017-03-15 DIAGNOSIS — J45909 Unspecified asthma, uncomplicated: Secondary | ICD-10-CM | POA: Insufficient documentation

## 2017-03-15 HISTORY — DX: Bipolar disorder, current episode mixed, unspecified: F31.60

## 2017-03-15 HISTORY — DX: Low back pain: M54.5

## 2017-03-15 HISTORY — DX: Alcohol abuse, uncomplicated: F10.10

## 2017-03-15 HISTORY — DX: Other chronic pain: G89.29

## 2017-03-15 HISTORY — DX: Morbid (severe) obesity due to excess calories: E66.01

## 2017-03-15 HISTORY — DX: Depression, unspecified: F32.A

## 2017-03-15 HISTORY — DX: Major depressive disorder, single episode, unspecified: F32.9

## 2017-03-15 HISTORY — DX: Low back pain, unspecified: M54.50

## 2017-03-15 NOTE — ED Notes (Signed)
Attempting to take the patient to a room and she is at the vending machines

## 2017-03-15 NOTE — ED Triage Notes (Signed)
Patient states that she has a a lot of stress going on right now. Reports that she has nasal congestion, cough with pghlem and headaches x 1 week

## 2017-03-15 NOTE — ED Notes (Signed)
Pt walking to room 9 to visit with friend. Pt was advised that she needed to stay in her room for the dr to see her. Pt continued to room 9.

## 2017-03-16 ENCOUNTER — Emergency Department (HOSPITAL_BASED_OUTPATIENT_CLINIC_OR_DEPARTMENT_OTHER): Payer: Medicare HMO

## 2017-03-16 MED ORDER — DM-GUAIFENESIN ER 30-600 MG PO TB12: 1.0000 | ORAL_TABLET | Freq: Two times a day (BID) | ORAL | Status: AC

## 2017-03-16 MED ORDER — IPRATROPIUM-ALBUTEROL 0.5-2.5 (3) MG/3ML IN SOLN
3.0000 mL | RESPIRATORY_TRACT | Status: DC
  Administered 2017-03-16: 3 mL via RESPIRATORY_TRACT
  Filled 2017-03-16: qty 3

## 2017-03-16 MED ORDER — BENZONATATE 100 MG PO CAPS
100.0000 mg | ORAL_CAPSULE | Freq: Three times a day (TID) | ORAL | 0 refills | Status: AC | PRN
Start: 2017-03-16 — End: ?

## 2017-03-16 MED ORDER — OXYMETAZOLINE HCL 0.05 % NA SOLN
NASAL | Status: AC
  Filled 2017-03-16: qty 15

## 2017-03-16 MED ORDER — ALBUTEROL SULFATE (2.5 MG/3ML) 0.083% IN NEBU
2.5000 mg | INHALATION_SOLUTION | Freq: Once | RESPIRATORY_TRACT | Status: AC
  Administered 2017-03-16: 2.5 mg via RESPIRATORY_TRACT
  Filled 2017-03-16: qty 3

## 2017-03-16 MED ORDER — OXYMETAZOLINE HCL 0.05 % NA SOLN
1.0000 | Freq: Once | NASAL | Status: AC
  Administered 2017-03-16: 1 via NASAL

## 2017-03-16 NOTE — ED Provider Notes (Signed)
[facility] Provider Note: Lowella Dell, MD, FACEP  CSN: [id number] MRN: [id number] ARRIVAL: 03/15/17 at 2245 ROOM: MH04/[facility]04   CHIEF COMPLAINT  Cough   HISTORY OF PRESENT ILLNESS  Courtney Garrett is a 36 y.o. female who complains of several days of cold symptoms. Specifically she has had nasal congestion, sneezing, rhinorrhea, headache, scratchy throat, subjective fever and productive cough. Symptoms are moderate. Her cough has not been relieved with her albuterol inhaler nor with NyQuil or Dimetapp.   Past Medical History:  Diagnosis Date  . Alcohol abuse   . Asthma   . Bipolar 1 disorder, mixed (HCC)   . Chronic midline low back pain   . Depression   . Hypertension   . Morbid obesity (HCC)   . Panic attack   . Seizures (HCC)     History reviewed. No pertinent surgical history.  History reviewed. No pertinent family history.  Social History  Substance Use Topics  . Smoking status: Current Every Day Smoker  . Smokeless tobacco: Never Used  . Alcohol use No    Prior to Admission medications   Medication Sig Start Date End Date Taking? Authorizing Provider  hydrochlorothiazide (HYDRODIURIL) 25 MG tablet Take 25 mg by mouth daily.   Yes Historical Provider, MD  benzonatate (TESSALON) 100 MG capsule Take 1 capsule (100 mg total) by mouth every 8 (eight) hours. 09/19/16   Audry Pili, PA-C  hydrOXYzine (ATARAX/VISTARIL) 25 MG tablet Take 1 tablet (25 mg total) by mouth every 8 (eight) hours as needed for anxiety. 08/17/15   Fayrene Helper, PA-C    Allergies Penicillins   REVIEW OF SYSTEMS  Negative except as noted here or in the History of Present Illness.   PHYSICAL EXAMINATION  Initial Vital Signs Blood pressure 125/81, pulse 88, temperature 99.4 F (37.4 C), temperature source Oral, resp. rate 18, height  (1.6 m), weight 226 lb (102.5 kg), SpO2 100 %.  Examination General: Well-developed, well-nourished female in no acute distress; appearance  consistent with age of record HENT: normocephalic; atraumatic; nasal congestion Eyes: pupils equal, round and reactive to light; extraocular muscles intact Neck: supple Heart: regular rate and rhythm Lungs: Inspiratory and expiratory wheezing Abdomen: soft; nondistended; nontender; bowel sounds present Extremities: No deformity; full range of motion; pulses normal Neurologic: Awake, alert and oriented; motor function intact in all extremities and symmetric; no facial droop Skin: Warm and dry Psychiatric: Normal mood and affect   RESULTS  Summary of this visit's results, reviewed by myself:   EKG Interpretation  Date/Time:    Ventricular Rate:    PR Interval:    QRS Duration:   QT Interval:    QTC Calculation:   R Axis:     Text Interpretation:        Laboratory Studies: No results found for this or any previous visit (from the past 24 hour(s)). Imaging Studies: Dg Chest 2 View  Result Date: 03/16/2017 CLINICAL DATA:  Shortness of breath EXAM: CHEST  2 VIEW COMPARISON:  Chest radiograph 09/19/2016 FINDINGS: The heart size and mediastinal contours are within normal limits. Both lungs are clear. The visualized skeletal structures are unremarkable. IMPRESSION: No active cardiopulmonary disease. Electronically Signed   By: Deatra Robinson M.D.   On: 03/16/2017 03:23    ED COURSE  Nursing notes and initial vitals signs, including pulse oximetry, reviewed.  Vitals:   03/15/17 2252 03/15/17 2254 03/16/17 0135 03/16/17 0212  BP: (!) 154/95  125/81   Pulse: 89  88  Resp: 18  18   Temp: 99.4 F (37.4 C)     TempSrc: Oral     SpO2: 100%  100% 98%  Weight:  226 lb (102.5 kg)    Height:   (1.6 m)     2:55 AM Lungs clear, air movement improved after albuterol and ipratropium neb treatment.  PROCEDURES    ED DIAGNOSES     ICD-9-CM ICD-10-CM   1. Acute bronchitis with bronchospasm 466.0 J20.9        Paula Libra, MD 03/16/17 [phone]

## 2017-03-16 NOTE — ED Notes (Signed)
Patient transported to X-ray 

## 2017-03-16 NOTE — ED Notes (Signed)
Pt's visitor comes out of room and states that "no nurse or dr has came to see my sister yet (the pt)." Pt's visitor informed of the wait. RN notified.

## 2017-06-06 ENCOUNTER — Emergency Department (HOSPITAL_BASED_OUTPATIENT_CLINIC_OR_DEPARTMENT_OTHER)
Admission: EM | Admit: 2017-06-06 | Discharge: 2017-06-06 | Disposition: A | Discharge status: Home / Self Care | Payer: Medicare HMO | Attending: Emergency Medicine | Admitting: Emergency Medicine

## 2017-06-06 ENCOUNTER — Emergency Department (HOSPITAL_BASED_OUTPATIENT_CLINIC_OR_DEPARTMENT_OTHER): Payer: Medicare HMO

## 2017-06-06 ENCOUNTER — Encounter (HOSPITAL_BASED_OUTPATIENT_CLINIC_OR_DEPARTMENT_OTHER): Payer: Self-pay | Admitting: *Deleted

## 2017-06-06 DIAGNOSIS — F17200 Nicotine dependence, unspecified, uncomplicated: Secondary | ICD-10-CM | POA: Diagnosis not present

## 2017-06-06 DIAGNOSIS — J45909 Unspecified asthma, uncomplicated: Secondary | ICD-10-CM | POA: Diagnosis not present

## 2017-06-06 DIAGNOSIS — M545 Low back pain: Secondary | ICD-10-CM | POA: Diagnosis present

## 2017-06-06 DIAGNOSIS — I10 Essential (primary) hypertension: Secondary | ICD-10-CM | POA: Insufficient documentation

## 2017-06-06 LAB — PREGNANCY, URINE: PREG TEST UR: NEGATIVE

## 2017-06-06 MED ORDER — IBUPROFEN 800 MG PO TABS: 800.0000 mg | ORAL_TABLET | Freq: Three times a day (TID) | ORAL | 0 refills | Status: AC

## 2017-06-06 MED ORDER — ACETAMINOPHEN 325 MG PO TABS
650.0000 mg | ORAL_TABLET | Freq: Once | ORAL | Status: AC
  Administered 2017-06-06: 650 mg via ORAL
  Filled 2017-06-06: qty 2

## 2017-06-06 MED ORDER — METHOCARBAMOL 500 MG PO TABS: 500.0000 mg | ORAL_TABLET | Freq: Two times a day (BID) | ORAL | 0 refills | Status: AC

## 2017-06-06 MED FILL — IBUPROFEN 800 MG TABLET: 800 | 10 days supply | Qty: 30 | Fill #0

## 2017-06-06 MED FILL — METHOCARBAMOL 500 MG TABLET: 500 | 10 days supply | Qty: 20 | Fill #0
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
## 2017-06-06 NOTE — ED Notes (Signed)
ED Provider at bedside. 
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
## 2017-06-06 NOTE — ED Notes (Signed)
Pt walked back from xray

## 2017-06-06 NOTE — Discharge Instructions (Signed)
Please read attached information regarding your condition. Take ibuprofen and Robaxin as directed. Apply heat to affected area and stretch as tolerated. Continue home medications as previously prescribed. Follow-up with PCP if symptoms persist. Return to ED for worsening back pain, injury, head injury, loss of consciousness, control of bladder, numbness, weakness or trouble walking.

## 2017-10-15 ENCOUNTER — Encounter (HOSPITAL_COMMUNITY): Payer: Self-pay

## 2017-10-15 ENCOUNTER — Emergency Department (HOSPITAL_COMMUNITY)
Admission: EM | Admit: 2017-10-15 | Discharge: 2017-10-16 | Disposition: A | Discharge status: Home / Self Care | Payer: Medicare HMO | Attending: Emergency Medicine | Admitting: Emergency Medicine

## 2017-10-15 DIAGNOSIS — Z79899 Other long term (current) drug therapy: Secondary | ICD-10-CM | POA: Insufficient documentation

## 2017-10-15 DIAGNOSIS — R45850 Homicidal ideations: Secondary | ICD-10-CM | POA: Diagnosis not present

## 2017-10-15 DIAGNOSIS — J45909 Unspecified asthma, uncomplicated: Secondary | ICD-10-CM | POA: Insufficient documentation

## 2017-10-15 DIAGNOSIS — F329 Major depressive disorder, single episode, unspecified: Secondary | ICD-10-CM | POA: Diagnosis present

## 2017-10-15 DIAGNOSIS — F3132 Bipolar disorder, current episode depressed, moderate: Secondary | ICD-10-CM | POA: Diagnosis not present

## 2017-10-15 DIAGNOSIS — F17200 Nicotine dependence, unspecified, uncomplicated: Secondary | ICD-10-CM | POA: Diagnosis not present

## 2017-10-15 DIAGNOSIS — I10 Essential (primary) hypertension: Secondary | ICD-10-CM | POA: Diagnosis not present

## 2017-10-15 DIAGNOSIS — Z91040 Latex allergy status: Secondary | ICD-10-CM | POA: Diagnosis not present

## 2017-10-15 LAB — CBC
HCT: 40.7 % (ref 36.0–46.0)
Hemoglobin: 13.2 g/dL (ref 12.0–15.0)
MCH: 25.3 pg — ABNORMAL LOW (ref 26.0–34.0)
MCHC: 32.4 g/dL (ref 30.0–36.0)
MCV: 78.1 fL (ref 78.0–100.0)
Platelets: 369 10*3/uL (ref 150–400)
RBC: 5.21 MIL/uL — ABNORMAL HIGH (ref 3.87–5.11)
RDW: 16.8 % — ABNORMAL HIGH (ref 11.5–15.5)
WBC: 6.6 10*3/uL (ref 4.0–10.5)

## 2017-10-15 LAB — COMPREHENSIVE METABOLIC PANEL
ALK PHOS: 75 U/L (ref 38–126)
ALT: 14 U/L (ref 14–54)
AST: 18 U/L (ref 15–41)
Albumin: 3.7 g/dL (ref 3.5–5.0)
Anion gap: 9 (ref 5–15)
BUN: 6 mg/dL (ref 6–20)
CO2: 22 mmol/L (ref 22–32)
CREATININE: 0.74 mg/dL (ref 0.44–1.00)
Calcium: 9 mg/dL (ref 8.9–10.3)
Chloride: 105 mmol/L (ref 101–111)
GFR calc Af Amer: >60 mL/min (ref >60)
Glucose, Bld: 129 mg/dL — ABNORMAL HIGH (ref 65–99)
Potassium: 3.5 mmol/L (ref 3.5–5.1)
SODIUM: 136 mmol/L (ref 135–145)
Total Bilirubin: 0.4 mg/dL (ref 0.3–1.2)
Total Protein: 7.5 g/dL (ref 6.5–8.1)

## 2017-10-15 LAB — RAPID URINE DRUG SCREEN, HOSP PERFORMED
Amphetamines: NOT DETECTED
Barbiturates: NOT DETECTED
Benzodiazepines: NOT DETECTED
Cocaine: NOT DETECTED
OPIATES: NOT DETECTED
Tetrahydrocannabinol: POSITIVE — AB

## 2017-10-15 LAB — ACETAMINOPHEN LEVEL: Acetaminophen (Tylenol), Serum: <10 ug/mL — ABNORMAL LOW (ref 10–30)

## 2017-10-15 LAB — I-STAT BETA HCG BLOOD, ED (MC, WL, AP ONLY): I-stat hCG, quantitative: <5 m[IU]/mL (ref <5)

## 2017-10-15 LAB — ETHANOL: Alcohol, Ethyl (B): <10 mg/dL (ref <10)

## 2017-10-15 LAB — SALICYLATE LEVEL

## 2017-10-15 MED ORDER — HYDROCHLOROTHIAZIDE 25 MG PO TABS
25.0000 mg | ORAL_TABLET | Freq: Every day | ORAL | Status: DC
  Administered 2017-10-16: 25 mg via ORAL
  Filled 2017-10-15 (×2): qty 1

## 2017-10-15 MED ORDER — ENALAPRIL-HYDROCHLOROTHIAZIDE 10-25 MG PO TABS: 1.0000 | ORAL_TABLET | Freq: Every day | ORAL | Status: DC

## 2017-10-15 MED ORDER — ENALAPRIL MALEATE 10 MG PO TABS
10.0000 mg | ORAL_TABLET | Freq: Every day | ORAL | Status: DC
  Administered 2017-10-15 – 2017-10-16 (×2): 10 mg via ORAL
  Filled 2017-10-15 (×2): qty 1

## 2017-10-15 MED ORDER — OXYCODONE HCL 5 MG PO TABS: 10.0000 mg | ORAL_TABLET | Freq: Four times a day (QID) | ORAL | Status: DC | PRN

## 2017-10-15 MED ORDER — ALBUTEROL SULFATE HFA 108 (90 BASE) MCG/ACT IN AERS: 2.0000 | INHALATION_SPRAY | Freq: Four times a day (QID) | RESPIRATORY_TRACT | Status: DC | PRN

## 2017-10-15 NOTE — ED Provider Notes (Signed)
[facility] EMERGENCY DEPARTMENT Provider Note   CSN: [id number] Arrival date & time: 10/15/17  1746     History   Chief Complaint Chief Complaint  Patient presents with  . Homicidal    HPI Courtney Garrett is a 36 y.o. female with past medical history of bipolar disorder, depression, panic attacks, who presents to ED for evaluation of homicidal ideations.  She states that she got into an argument with her ex-wife due to not being able to sign her divorce papers.  She also states that she is more depressed and under more stress than usual because her 25 year old daughter was taken away from her by protective services.  She states that "I just want to get rid of my ex-wife."  She reports compliance with her home medications.  However she states that she wants something stronger to take to help with her anxiety and depression.  She denies any SI, auditory or visual hallucinations.  She denies any overdose on medications.  She reports occasional marijuana use but denies any other drug use.  HPI  Past Medical History:  Diagnosis Date  . Alcohol abuse   . Asthma   . Bipolar 1 disorder, mixed (HCC)   . Chronic midline low back pain   . Depression   . Hypertension   . Morbid obesity (HCC)   . Panic attack   . Seizures (HCC)     There are no active problems to display for this patient.   History reviewed. No pertinent surgical history.  OB History    No data available       Home Medications    Prior to Admission medications   Medication Sig Start Date End Date Taking? Authorizing Provider  albuterol (PROVENTIL HFA;VENTOLIN HFA) 108 (90 Base) MCG/ACT inhaler Inhale 2 puffs into the lungs every 6 (six) hours as needed for wheezing or shortness of breath.   Yes [provider]  albuterol (PROVENTIL) (2.5 MG/3ML) 0.083% nebulizer solution Take 2.5 mg by nebulization every 6 (six) hours as needed for wheezing or shortness of breath.   Yes [provider]  clonazePAM (KLONOPIN) 1 MG tablet Take 1 mg by mouth at bedtime. 09/26/17  Yes [provider]  divalproex (DEPAKOTE ER) 500 MG 24 hr tablet Take 500 mg by mouth 3 (three) times daily. 09/24/17  Yes [provider]  enalapril-hydrochlorothiazide (VASERETIC) 10-25 MG tablet Take 1 tablet by mouth daily. 09/03/17  Yes [provider]  naloxone (NARCAN) nasal spray 4 mg/0.1 mL Place 1 spray into the nose once as needed (opiod overdose).   Yes [provider]  Oxycodone HCl 10 MG TABS Take 10 mg by mouth 4 (four) times daily as needed (pain).  09/26/17  Yes [provider]  benzonatate (TESSALON) 100 MG capsule Take 1 capsule (100 mg total) by mouth 3 (three) times daily as needed for cough. Patient not taking: Reported on 10/15/2017 03/16/17   Molpus, Jonny Ruiz, MD  dextromethorphan-guaiFENesin [facility] DM) 30-600 MG 12hr tablet Take 1 tablet by mouth 2 (two) times daily. Patient not taking: Reported on 10/15/2017 03/16/17   Molpus, Jonny Ruiz, MD  hydrOXYzine (ATARAX/VISTARIL) 25 MG tablet Take 1 tablet (25 mg total) by mouth every 8 (eight) hours as needed for anxiety. Patient not taking: Reported on 10/15/2017 08/17/15   Fayrene Helper, PA-C  ibuprofen (ADVIL,MOTRIN) 800 MG tablet Take 1 tablet (800 mg total) by mouth 3 (three) times daily. Patient not taking: Reported on 10/15/2017 06/06/17   Dietrich Pates,  PA-C  methocarbamol (ROBAXIN) 500 MG tablet Take 1 tablet (500 mg total) by mouth 2 (two) times daily. Patient not taking: Reported on 10/15/2017 06/06/17   Dietrich PatesKhatri, Macen Joslin, PA-C    Family History No family history on file.  Social History Social History   Tobacco Use  . Smoking status: Current Every Day Smoker  . Smokeless tobacco: Never Used  Substance Use Topics  . Alcohol use: No  . Drug use: No     Allergies   Iodinated diagnostic agents; Mushroom extract complex; Penicillins; Shellfish allergy; Fish allergy; Ketorolac; Latex;  Levofloxacin; Other; and Sulfamethoxazole   Review of Systems Review of Systems  Constitutional: Negative for appetite change, chills and fever.  HENT: Negative for ear pain, rhinorrhea, sneezing and sore throat.   Eyes: Negative for photophobia and visual disturbance.  Respiratory: Negative for cough, chest tightness, shortness of breath and wheezing.   Cardiovascular: Negative for chest pain and palpitations.  Gastrointestinal: Negative for abdominal pain, blood in stool, constipation, diarrhea, nausea and vomiting.  Genitourinary: Negative for dysuria, hematuria and urgency.  Musculoskeletal: Positive for back pain. Negative for myalgias.  Skin: Negative for rash.  Neurological: Negative for dizziness, weakness and light-headedness.  Psychiatric/Behavioral: Negative for self-injury and suicidal ideas. The patient is nervous/anxious. The patient is not hyperactive.      Physical Exam Updated Vital Signs BP (!) 156/108   Pulse 86   Temp 97.8 F (36.6 C) (Oral)   Resp 18   Ht 5\' 3"  (1.6 m)   Wt 93.9 kg (207 lb)   SpO2 100%   BMI 36.67 kg/m   Physical Exam  Constitutional: She appears well-developed and well-nourished. No distress.  HENT:  Head: Normocephalic and atraumatic.  Nose: Nose normal.  Eyes: Conjunctivae and EOM are normal. Left eye exhibits no discharge. No scleral icterus.  Neck: Normal range of motion. Neck supple.  Cardiovascular: Normal rate, regular rhythm, normal heart sounds and intact distal pulses. Exam reveals no gallop and no friction rub.  No murmur heard. Pulmonary/Chest: Effort normal and breath sounds normal. No respiratory distress.  Abdominal: Soft. Bowel sounds are normal. She exhibits no distension. There is no tenderness. There is no guarding.  Musculoskeletal: Normal range of motion. She exhibits no edema.  Neurological: She is alert. She exhibits normal muscle tone. Coordination normal.  Skin: Skin is warm and dry. No rash noted.    Psychiatric: She has a normal mood and affect.  Nursing note and vitals reviewed.    ED Treatments / Results  Labs (all labs ordered are listed, but only abnormal results are displayed) Labs Reviewed  COMPREHENSIVE METABOLIC PANEL - Abnormal; Notable for the following components:      Result Value   Glucose, Bld 129 (*)    All other components within normal limits  ACETAMINOPHEN LEVEL - Abnormal; Notable for the following components:   Acetaminophen (Tylenol), Serum <10 (*)    All other components within normal limits  CBC - Abnormal; Notable for the following components:   RBC 5.21 (*)    MCH 25.3 (*)    RDW 16.8 (*)    All other components within normal limits  RAPID URINE DRUG SCREEN, HOSP PERFORMED - Abnormal; Notable for the following components:   Tetrahydrocannabinol POSITIVE (*)    All other components within normal limits  ETHANOL  SALICYLATE LEVEL  I-STAT BETA HCG BLOOD, ED ([facility], [facility], [facility] ONLY)    EKG  EKG Interpretation None       Radiology No results  found.  Procedures Procedures (including critical care time)  Medications Ordered in ED Medications - No data to display   Initial Impression / Assessment and Plan / ED Course  I have reviewed the triage vital signs and the nursing notes.  Pertinent labs & imaging results that were available during my care of the patient were reviewed by me and considered in my medical decision making (see chart for details).     Patient presents to ED for evaluation of homicidal ideations towards her ex-wife.  She reports she is in a divorce and custody battle regarding her 35 year old child.  She states that "I just want to get rid of her" when she speaks that her ex-wife.  She denies any SI, auditory or visual hallucinations.  She denies any medication over dosages.  She reports back pain which is chronic for her.  Her labs are reassuring.  Will order her home medications.  Patient is medically cleared for TTS  evaluation.  Final Clinical Impressions(s) / ED Diagnoses   Final diagnoses:  Homicidal ideation    ED Discharge Orders    None       Dietrich PatesKhatri, Sahmir Weatherbee, PA-C 10/15/17 2243    Little, Ambrose Finlandachel Morgan, MD 10/15/17 [phone]
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
## 2017-10-15 NOTE — ED Notes (Signed)
Pt making phone call x1

## 2017-10-16 DIAGNOSIS — F17210 Nicotine dependence, cigarettes, uncomplicated: Secondary | ICD-10-CM | POA: Diagnosis not present

## 2017-10-16 DIAGNOSIS — F1910 Other psychoactive substance abuse, uncomplicated: Secondary | ICD-10-CM

## 2017-10-16 DIAGNOSIS — F3132 Bipolar disorder, current episode depressed, moderate: Secondary | ICD-10-CM

## 2017-10-16 MED ORDER — CLONAZEPAM 0.5 MG PO TABS: 1.0000 mg | ORAL_TABLET | Freq: Every day | ORAL | Status: DC

## 2017-10-16 MED ORDER — DIVALPROEX SODIUM ER 500 MG PO TB24
500.0000 mg | ORAL_TABLET | Freq: Three times a day (TID) | ORAL | Status: DC
  Administered 2017-10-16: 500 mg via ORAL
  Filled 2017-10-16: qty 1
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
## 2017-10-16 NOTE — ED Provider Notes (Signed)
2:45 AM  D/w Berna SpareMarcus with TTS.  They have recommended reevaluation in the morning by psychiatry   Isaul Landi, Layla MawKristen N, DO 10/16/17 [id number]
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
## 2017-10-16 NOTE — ED Notes (Signed)
Belongings placed in locker 3, valuables envelope in security safe.

## 2017-10-16 NOTE — ED Notes (Signed)
D/C instructions given and questions answered to satisfaction - pt voiced she has an appt w/[facility] on 11/07/17 - states she is going to call to inquire if may move appt up earlier. Pt dressed into her personal clothing. Voiced she has all of her belongings.

## 2017-10-16 NOTE — ED Notes (Signed)
Received call from pharmacy regarding patient hydrochlorothiazide. Updated on why medication is being administered separately. Patient is asleep at this time. Will provide patient with information.

## 2017-10-16 NOTE — ED Notes (Addendum)
Regular diet lunch tray ordered @ 1009, w/ 'no sharps' requested.  

## 2017-10-16 NOTE — ED Notes (Signed)
TTS in process 

## 2017-10-16 NOTE — ED Notes (Signed)
Pt given copy of Medical Clearance Pt Policy form - voiced understanding. 
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
## 2017-10-16 NOTE — Discharge Instructions (Signed)
Return to the ED with any concerns including thoughts or feelings of homicide or suicide, or any other alarming symptoms 

## 2017-10-16 NOTE — Progress Notes (Signed)
Per Courtney Ruffina Okonkwo, NP, the patient does not meet criteria for inpatient treatment. The patient is recommended for discharge and to continue to follow [facility] for outpatient medication management and therapy services.   The patient is psychiatrically cleared at this time.   The patient has an appointment with her psychiatrist at [facility] on 11/07/17.   Courtney Bananaebecca Berman, RN notified.      Courtney Garrett MSW, LCSWA CSW Disposition [phone]

## 2017-10-16 NOTE — ED Notes (Signed)
Telepsych completed.  

## 2018-09-05 ENCOUNTER — Emergency Department (HOSPITAL_COMMUNITY): Payer: Medicare HMO

## 2018-09-05 ENCOUNTER — Emergency Department (HOSPITAL_COMMUNITY)
Admission: EM | Admit: 2018-09-05 | Discharge: 2018-09-05 | Disposition: A | Discharge status: Home / Self Care | Payer: Medicare HMO | Attending: Emergency Medicine | Admitting: Emergency Medicine

## 2018-09-05 ENCOUNTER — Encounter (HOSPITAL_COMMUNITY): Payer: Self-pay | Admitting: *Deleted

## 2018-09-05 ENCOUNTER — Other Ambulatory Visit: Payer: Self-pay

## 2018-09-05 DIAGNOSIS — M25476 Effusion, unspecified foot: Secondary | ICD-10-CM

## 2018-09-05 DIAGNOSIS — Z91040 Latex allergy status: Secondary | ICD-10-CM | POA: Insufficient documentation

## 2018-09-05 DIAGNOSIS — Z79899 Other long term (current) drug therapy: Secondary | ICD-10-CM | POA: Insufficient documentation

## 2018-09-05 DIAGNOSIS — F17210 Nicotine dependence, cigarettes, uncomplicated: Secondary | ICD-10-CM | POA: Diagnosis not present

## 2018-09-05 DIAGNOSIS — J45909 Unspecified asthma, uncomplicated: Secondary | ICD-10-CM | POA: Diagnosis not present

## 2018-09-05 DIAGNOSIS — I10 Essential (primary) hypertension: Secondary | ICD-10-CM | POA: Diagnosis not present

## 2018-09-05 DIAGNOSIS — M25474 Effusion, right foot: Secondary | ICD-10-CM | POA: Insufficient documentation

## 2018-09-05 DIAGNOSIS — M25571 Pain in right ankle and joints of right foot: Secondary | ICD-10-CM | POA: Diagnosis present

## 2018-09-05 NOTE — ED Triage Notes (Signed)
Pt reports Rt foot pain. Pt reports in store and a metal piece hit foot.

## 2018-09-05 NOTE — Discharge Instructions (Addendum)
You were seen in the ER for foot pain after injury.  X-ray shows soft tissue swelling but no fractures.  Take 600 mg of ibuprofen +500 mg of acetaminophen every 6 hours for the next 3 to 5 days for pain and swelling.  Ice.  Elevate.  Avoid any activities that exacerbate your pain.  Wear your postop boot for comfort during work.  Return to the ER if there is worsening swelling, redness, warmth, fevers, pain or swelling to your calf.  X-ray results below  CLINICAL DATA:  37 year old female status post blunt trauma from  metal shopping rack falling and striking foot.     EXAM:  RIGHT FOOT COMPLETE - 3+ VIEW     COMPARISON:  None.     FINDINGS:  Distal right foot soft tissue swelling at the metatarsal level.  Metatarsals and phalanges appear intact. Tarsal bones appear intact.  No soft tissue gas.     IMPRESSION:  Soft tissue swelling with no acute fracture or dislocation  identified in the right foot.        Electronically Signed    By: Odessa Fleming M.D.    On: 09/05/2018 16:29

## 2018-09-05 NOTE — ED Provider Notes (Signed)
[facility] EMERGENCY DEPARTMENT Provider Note   CSN: [id number] Arrival date & time: 09/05/18  1414     History   Chief Complaint Chief Complaint  Patient presents with  . Foot Pain    HPI Courtney Garrett is a 37 y.o. female is here for evaluation of right foot pain.  That began last week.  The pain is located to the top of her midfoot.  Pain began suddenly after a metal rack was dropped on it at the [employer] store.  She has filed a complaint with Engineer, site of the dollar store.  The pain is moderate, worsening.  Worse with palpation and weightbearing.  The pain shoots up into her right leg and hip.  She has been elevating it for the swelling.  No other interventions for the symptoms.  No alleviating factors.  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  Her pain is worse after work.  Denies loss of sensation, paresthesias, redness, warmth.  No calf pain or swelling.  No previous injuries to the foot.  HPI  Past Medical History:  Diagnosis Date  . Alcohol abuse   . Asthma   . Bipolar 1 disorder, mixed (HCC)   . Chronic midline low back pain   . Depression   . Hypertension   . Morbid obesity (HCC)   . Panic attack   . Seizures (HCC)     There are no active problems to display for this patient.   History reviewed. No pertinent surgical history.   OB History   None      Home Medications    Prior to Admission medications   Medication Sig Start Date End Date Taking? Authorizing Provider  albuterol (PROVENTIL HFA;VENTOLIN HFA) 108 (90 Base) MCG/ACT inhaler Inhale 2 puffs into the lungs every 6 (six) hours as needed for wheezing or shortness of breath.    [provider]  albuterol (PROVENTIL) (2.5 MG/3ML) 0.083% nebulizer solution Take 2.5 mg by nebulization every 6 (six) hours as needed for wheezing or shortness of breath.    [provider]  benzonatate (TESSALON) 100 MG capsule Take 1 capsule (100 mg  total) by mouth 3 (three) times daily as needed for cough. Patient not taking: Reported on 10/15/2017 03/16/17   Molpus, Jonny Ruiz, MD  clonazePAM (KLONOPIN) 1 MG tablet Take 1 mg by mouth at bedtime. 09/26/17   [provider]  dextromethorphan-guaiFENesin (MUCINEX DM) 30-600 MG 12hr tablet Take 1 tablet by mouth 2 (two) times daily. Patient not taking: Reported on 10/15/2017 03/16/17   Molpus, Jonny Ruiz, MD  divalproex (DEPAKOTE ER) 500 MG 24 hr tablet Take 500 mg by mouth 3 (three) times daily. 09/24/17   [provider]  enalapril-hydrochlorothiazide (VASERETIC) 10-25 MG tablet Take 1 tablet by mouth daily. 09/03/17   [provider]  hydrOXYzine (ATARAX/VISTARIL) 25 MG tablet Take 1 tablet (25 mg total) by mouth every 8 (eight) hours as needed for anxiety. Patient not taking: Reported on 10/15/2017 08/17/15   Fayrene Helper, PA-C  ibuprofen (ADVIL,MOTRIN) 800 MG tablet Take 1 tablet (800 mg total) by mouth 3 (three) times daily. Patient not taking: Reported on 10/15/2017 06/06/17   Dietrich Pates, PA-C  methocarbamol (ROBAXIN) 500 MG tablet Take 1 tablet (500 mg total) by mouth 2 (two) times daily. Patient not taking: Reported on 10/15/2017 06/06/17   Dietrich Pates, PA-C  naloxone [facility]) nasal spray 4 mg/0.1 mL Place 1 spray into the nose once as  needed (opiod overdose).    [provider]  Oxycodone HCl 10 MG TABS Take 10 mg by mouth 4 (four) times daily as needed (pain).  09/26/17   [provider]    Family History History reviewed. No pertinent family history.  Social History Social History   Tobacco Use  . Smoking status: Current Every Day Smoker  . Smokeless tobacco: Never Used  Substance Use Topics  . Alcohol use: No  . Drug use: No     Allergies   Iodinated diagnostic agents; Mushroom extract complex; Penicillins; Shellfish allergy; Fish allergy; Ketorolac; Latex; Levofloxacin; Other; and Sulfamethoxazole   Review of Systems Review of Systems    Musculoskeletal: Positive for arthralgias and gait problem.  All other systems reviewed and are negative.    Physical Exam Updated Vital Signs BP (!) 163/112 (BP Location: Right Arm)   Pulse 73   Temp 97.9 F (36.6 C) (Oral)   Resp 16   Ht 5\' 3"  (1.6 m)   Wt 90.3 kg   SpO2 100%   BMI 35.25 kg/m   Physical Exam  Constitutional: She is oriented to person, place, and time. She appears well-developed and well-nourished.  Non-toxic appearance.  HENT:  Head: Normocephalic.  Right Ear: External ear normal.  Left Ear: External ear normal.  Nose: Nose normal.  Eyes: Conjunctivae and EOM are normal.  Neck: Full passive range of motion without pain.  Cardiovascular: Normal rate.  1+ PT and DP pulses bilaterally.  No calf edema or tenderness.  Brisk cap refill to toes  Pulmonary/Chest: Effort normal. No tachypnea. No respiratory distress.  Musculoskeletal: Normal range of motion. She exhibits tenderness.  Right foot: Mild tenderness to the dorsal midfoot and anterior aspect of the lateral malleolus.  No obvious local edema, erythema, warmth.  No focal bony tenderness to the Achilles, MTPs, toes.  Full passive range of motion of the ankle, pain reported with plantarflexion and inversion.  Achilles is nontender.  Neurological: She is alert and oriented to person, place, and time.  Sensation to light touch intact in bilateral feet.  Ankle plantarflexion and dorsiflexion against resistance is intact.  Skin: Skin is warm and dry. Capillary refill takes less than 2 seconds.  Psychiatric: Her behavior is normal. Thought content normal.     ED Treatments / Results  Labs (all labs ordered are listed, but only abnormal results are displayed) Labs Reviewed - No data to display  EKG None  Radiology Dg Foot Complete Right  Result Date: 09/05/2018 CLINICAL DATA:  37 year old female status post blunt trauma from metal shopping rack falling and striking foot. EXAM: RIGHT FOOT COMPLETE - 3+  VIEW COMPARISON:  None. FINDINGS: Distal right foot soft tissue swelling at the metatarsal level. Metatarsals and phalanges appear intact. Tarsal bones appear intact. No soft tissue gas. IMPRESSION: Soft tissue swelling with no acute fracture or dislocation identified in the right foot. Electronically Signed   By: Odessa Fleming M.D.   On: 09/05/2018 16:29    Procedures Procedures (including critical care time)  Medications Ordered in ED Medications - No data to display   Initial Impression / Assessment and Plan / ED Course  I have reviewed the triage vital signs and the nursing notes.  Pertinent labs & imaging results that were available during my care of the patient were reviewed by me and considered in my medical decision making (see chart for details).  Clinical Course as of Sep 05 1640  Thu Sep 05, 2018  1634 FINDINGS:  Distal right foot soft tissue swelling at the metatarsal level. Metatarsals and phalanges appear intact. Tarsal bones appear intact. No soft tissue gas.  IMPRESSION: Soft tissue swelling with no acute fracture or dislocation identified in the right foot.   DG Foot Complete Right [CG]    Clinical Course User Index [CG] Liberty Handy, PA-C    38 year old here with the dorsal midfoot pain after trauma.  She has focal bony tenderness to the metatarsals concerning for fracture.  X-ray however is negative for fracture and shows soft tissue swelling.  Extremities neurovascularly intact.  No signs of cellulitis.  She has no history of gout and this is considered less likely.  Doubt DVT. Will discharge with symptomatic management.  I offered a postop boot to try and see if this will control her pain during work.  Follow-up with podiatry.  Discussed return precautions.  Patient is in agreement and comfortable with the plan.  Final Clinical Impressions(s) / ED Diagnoses   Final diagnoses:  Soft tissue swelling of joint of foot    ED Discharge Orders    None         Liberty Handy, PA-C 09/05/18 1641    Arby Barrette, MD 09/13/18 1550

## 2018-09-05 NOTE — ED Notes (Signed)
Declined W/C at D/C and was escorted to lobby by RN. 
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
## 2020-01-05 IMAGING — DX DG FOOT COMPLETE 3+V*R*
3 series · 3 of 3 positions shown · non-contrast
Comparison: None.

CLINICAL DATA: 37-year-old female status post blunt trauma from
metal shopping [employer] and striking foot.

EXAM:
RIGHT FOOT COMPLETE - 3+ VIEW

[foot ap]
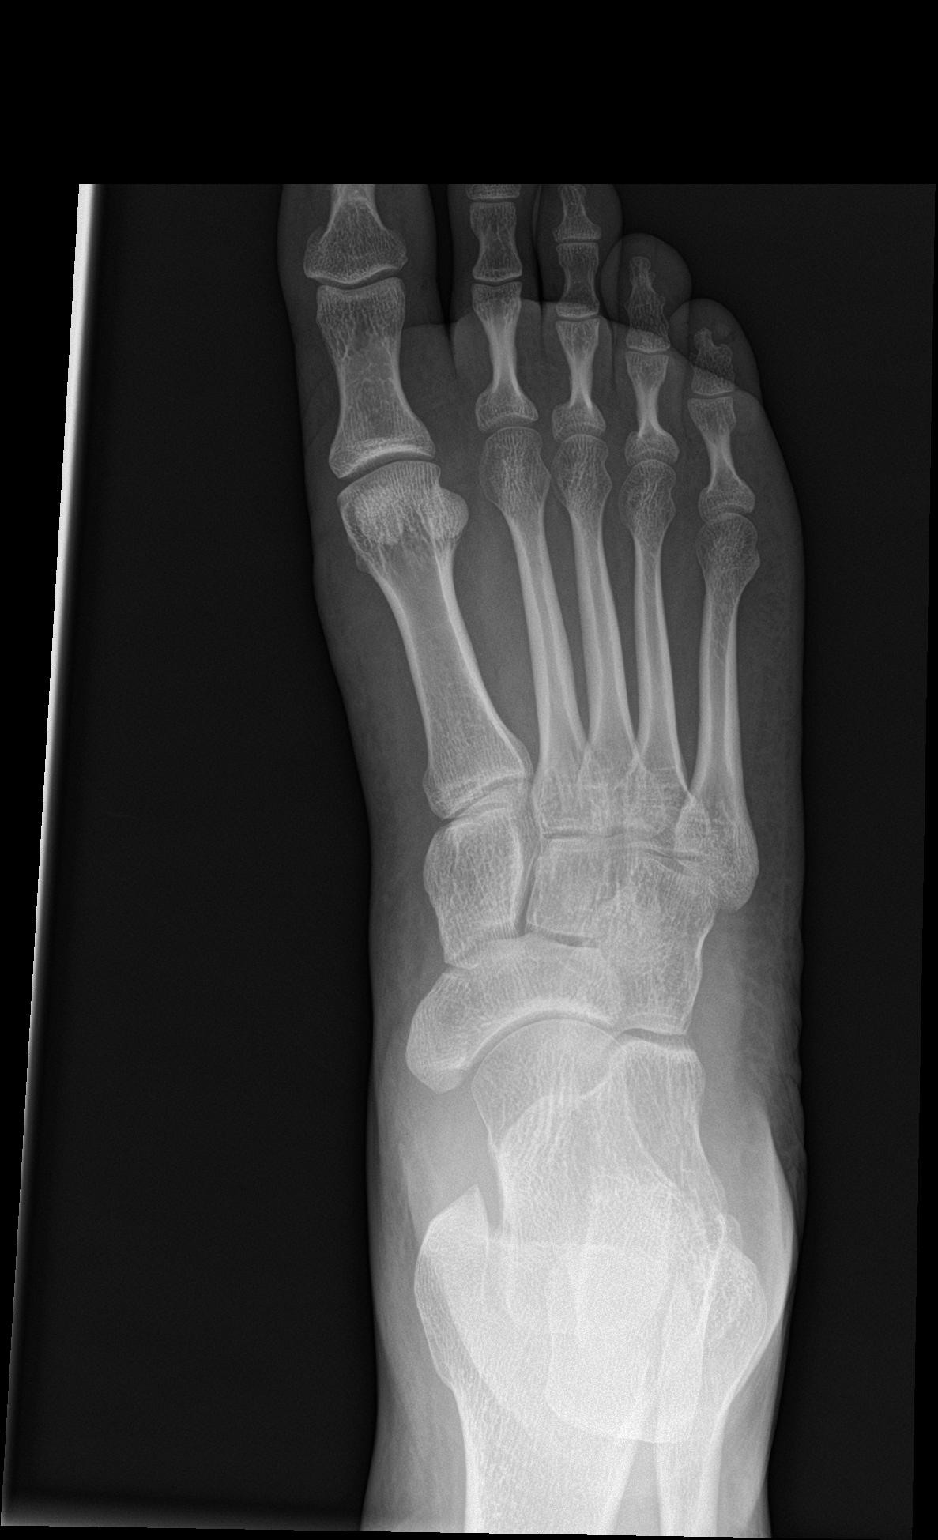

[foot obl]
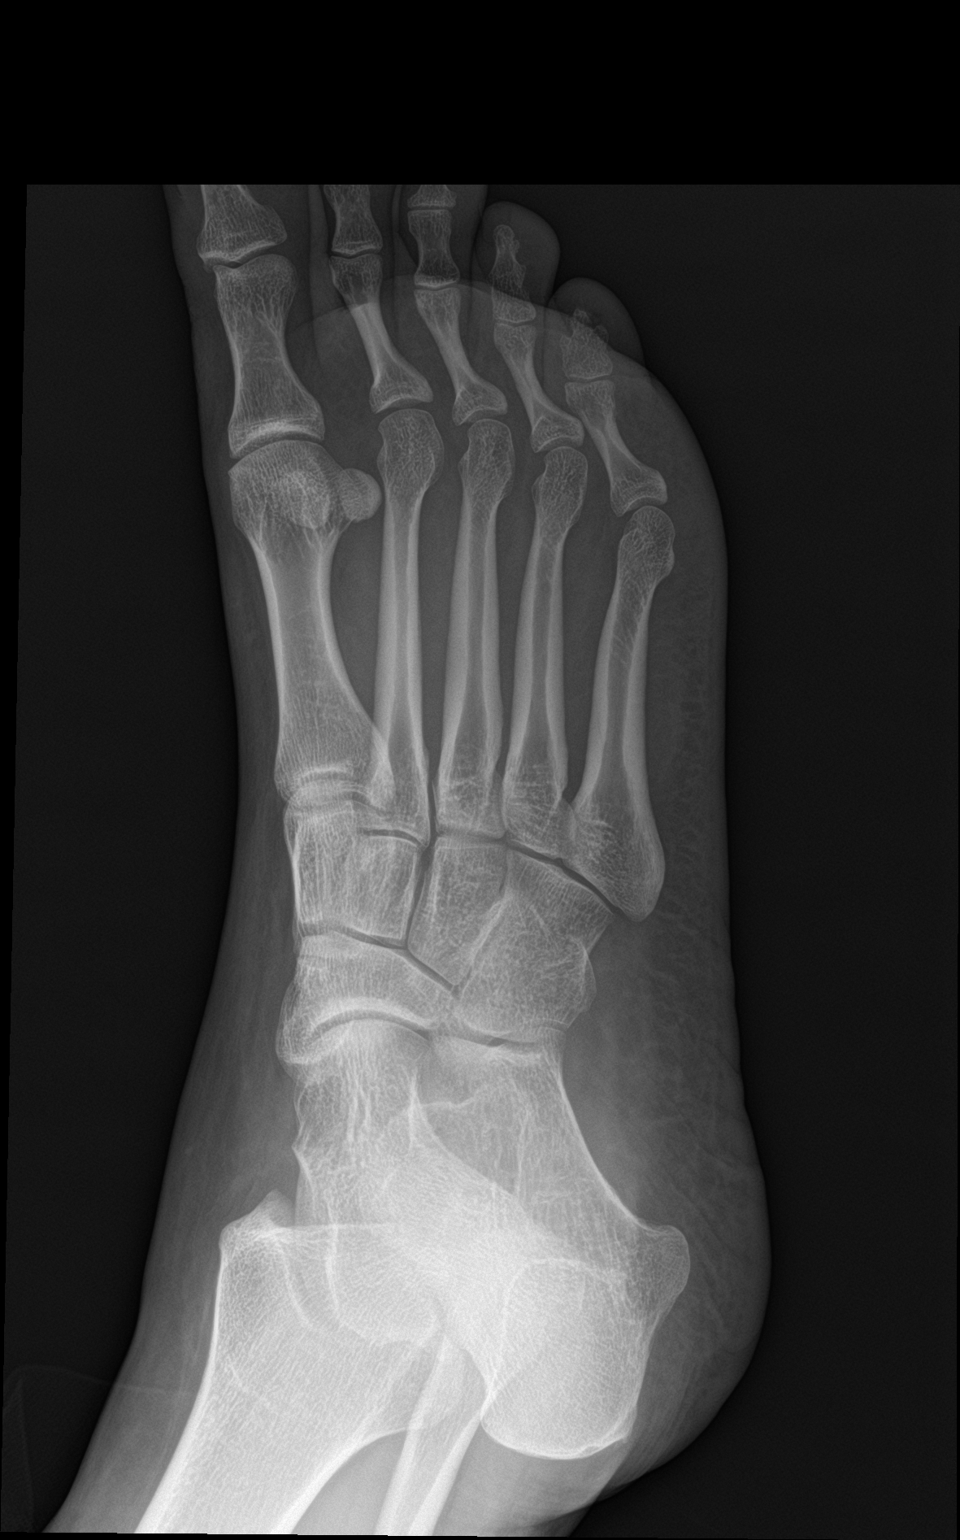

[foot lat]
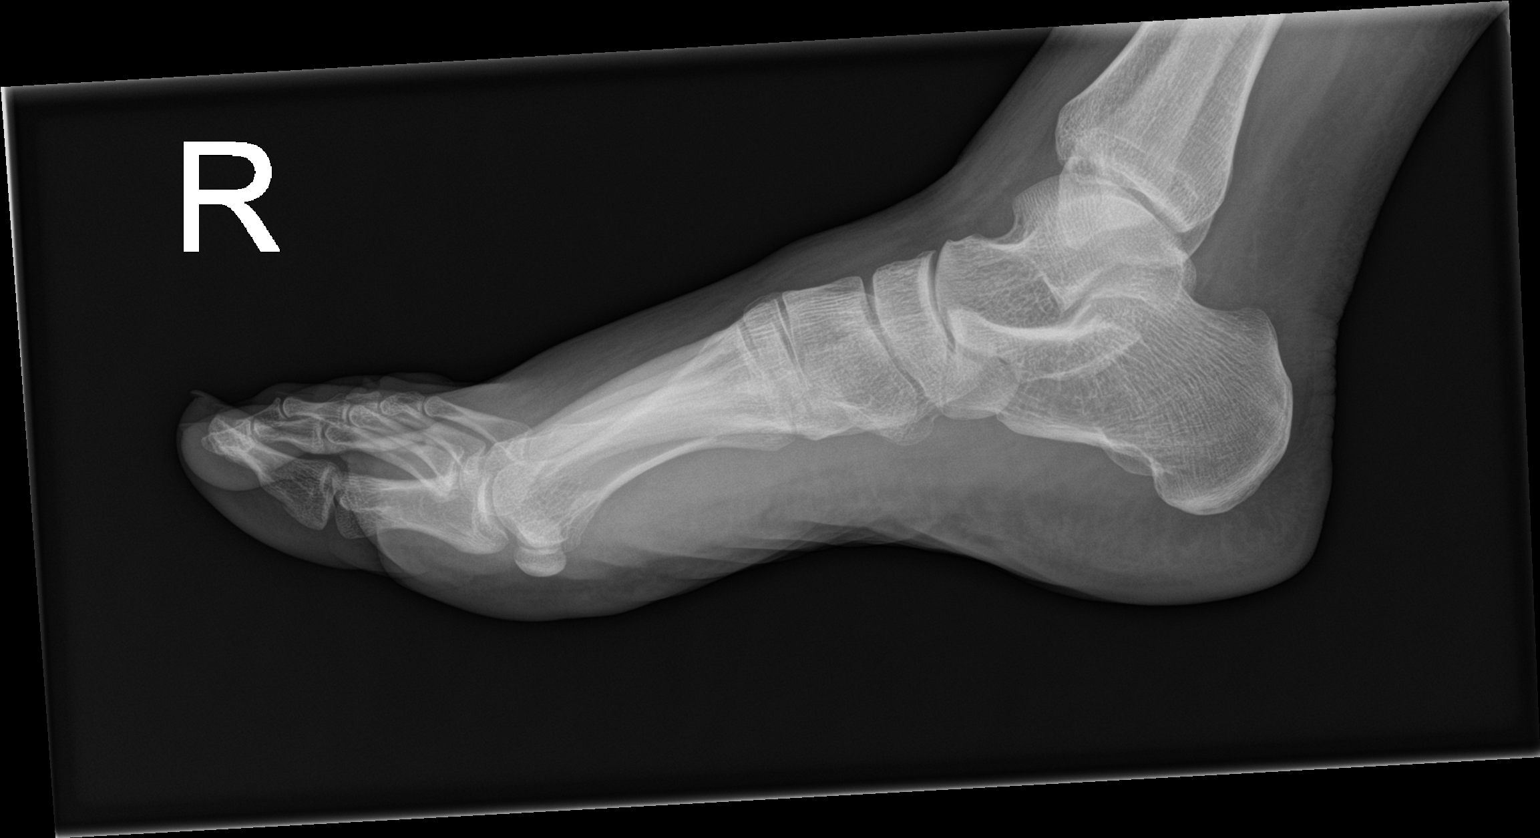

[3 of 3 positions shown; findings below may reference images not displayed]

FINDINGS: Distal right foot soft tissue swelling at the metatarsal level.
Metatarsals and phalanges appear intact. Tarsal bones appear intact.
No soft tissue gas.
IMPRESSION: Soft tissue swelling with no acute fracture or dislocation
identified in the right foot.

## 2022-06-08 ENCOUNTER — Ambulatory Visit: Payer: Medicare HMO | Admitting: Physician Assistant
